# Patient Record
Sex: Female | Born: 1970 | Hispanic: Yes | Marital: Married | Smoking: Never smoker
Health system: Southern US, Community
[De-identification: ages and names within clinical notes are randomized; demographics above are authoritative.]

## PROBLEM LIST (undated history)

## (undated) DIAGNOSIS — I519 Heart disease, unspecified: Secondary | ICD-10-CM

## (undated) DIAGNOSIS — E119 Type 2 diabetes mellitus without complications: Secondary | ICD-10-CM

## (undated) HISTORY — DX: Type 2 diabetes mellitus without complications: E11.9

## (undated) HISTORY — DX: Heart disease, unspecified: I51.9

---

## 2006-03-10 ENCOUNTER — Ambulatory Visit: Payer: Self-pay

## 2006-05-25 ENCOUNTER — Emergency Department: Payer: Self-pay | Admitting: Emergency Medicine

## 2006-06-24 ENCOUNTER — Ambulatory Visit: Payer: Self-pay | Admitting: Obstetrics & Gynecology

## 2006-06-30 ENCOUNTER — Ambulatory Visit: Payer: Self-pay | Admitting: Obstetrics & Gynecology

## 2013-03-01 HISTORY — PX: HYSTEROSCOPY WITH D & C: SHX1775

## 2013-09-04 ENCOUNTER — Ambulatory Visit: Payer: Self-pay | Admitting: Obstetrics & Gynecology

## 2013-09-04 LAB — BASIC METABOLIC PANEL
ANION GAP: 8 (ref 7–16)
BUN: 17 mg/dL (ref 7–18)
CHLORIDE: 106 mmol/L (ref 98–107)
CO2: 26 mmol/L (ref 21–32)
Calcium, Total: 9 mg/dL (ref 8.5–10.1)
Creatinine: 0.73 mg/dL (ref 0.60–1.30)
EGFR (Non-African Amer.): >60
Glucose: 151 mg/dL — ABNORMAL HIGH (ref 65–99)
Osmolality: 284 (ref 275–301)
Potassium: 4.4 mmol/L (ref 3.5–5.1)
Sodium: 140 mmol/L (ref 136–145)

## 2013-09-04 LAB — CBC
HCT: 37.5 % (ref 35.0–47.0)
HGB: 12.2 g/dL (ref 12.0–16.0)
MCH: 27.2 pg (ref 26.0–34.0)
MCHC: 32.6 g/dL (ref 32.0–36.0)
MCV: 83 fL (ref 80–100)
Platelet: 366 10*3/uL (ref 150–440)
RBC: 4.5 10*6/uL (ref 3.80–5.20)
RDW: 16.2 % — ABNORMAL HIGH (ref 11.5–14.5)
WBC: 11.9 10*3/uL — ABNORMAL HIGH (ref 3.6–11.0)

## 2013-09-07 ENCOUNTER — Ambulatory Visit: Payer: Self-pay | Admitting: Obstetrics & Gynecology

## 2013-09-10 LAB — PATHOLOGY REPORT

## 2014-06-22 NOTE — Op Note (Signed)
PATIENT NAME:  Alexandra Bender, Charma MR#:  [id number] DATE OF BIRTH:  [birth year]  DATE OF PROCEDURE:  09/07/2013  PREOPERATIVE DIAGNOSIS: Abnormal uterine bleeding with menorrhagia and endometrial polyps.   POSTOPERATIVE DIAGNOSIS: Abnormal uterine bleeding with menorrhagia and endometrial polyps.   PROCEDURE PERFORMED: Hysteroscopy, dilation and curettage procedure.   SURGEON: Annamarie MajorPaul Harris, M.D.   ANESTHESIA: General.   ESTIMATED BLOOD LOSS: Minimal.   COMPLICATIONS: None.   FINDINGS: Proliferative endometrial lining with some polyp-like tissue involved.   SPECIMEN: Endometrial curettage.   DISPOSITION: Recovery room stable.   TECHNIQUE: The patient is prepped and draped in the usual sterile fashion.  After adequate anesthesia is obtained in the dorsal lithotomy position, bladder is drained with a Robinson catheter. Speculum is placed and the cervix is grasped with a tenaculum. The uterus is sounded to 8 cm.  Cervix is gently dilated with Hegar dilators.  A 30 degree hysteroscope is inserted with distention of the intrauterine cavity using lactated Ringer's. The above-mentioned findings are visualized. Using the MyoSure device, the thickened areas are removed through a resection process. Hysteroscope was removed with minimal discrepancy of fluid. A gentle curettage with a banjo curette is then performed.  Specimen is sent to pathology for further review. The patient has minimal bleeding. Tenaculum is removed and the patient went to recovery in stable condition. All sponge, instrument and needle counts are correct.     ____________________________ R. Annamarie MajorPaul Harris, MD rph:ts D: 09/07/2013 13:20:47 ET T: 09/07/2013 13:38:22 ET JOB#: [id number]  cc: Dierdre Searles. Paul Harris, MD, <Dictator> Nadara MustardOBERT P HARRIS MD ELECTRONICALLY SIGNED 09/07/2013 18:08

## 2017-08-02 ENCOUNTER — Telehealth: Payer: Self-pay | Admitting: Obstetrics & Gynecology

## 2017-08-02 NOTE — Telephone Encounter (Signed)
[facility] referring for Dysfunctional uterine bleeding. Called the interpreter service call patient. Albin FellingCarla called and left voicemail for patient to call back to be schedule

## 2017-08-15 NOTE — Telephone Encounter (Signed)
left voicemail for patient to call back to be schedule

## 2017-09-08 ENCOUNTER — Other Ambulatory Visit (HOSPITAL_COMMUNITY)
Admission: RE | Admit: 2017-09-08 | Discharge: 2017-09-08 | Disposition: A | Discharge status: Home / Self Care | Payer: Commercial Managed Care - PPO | Source: Ambulatory Visit | Attending: Obstetrics & Gynecology | Admitting: Obstetrics & Gynecology

## 2017-09-08 ENCOUNTER — Ambulatory Visit: Payer: Commercial Managed Care - PPO | Admitting: Obstetrics & Gynecology

## 2017-09-08 ENCOUNTER — Encounter: Payer: Self-pay | Admitting: Obstetrics & Gynecology

## 2017-09-08 VITALS — BP 120/80 | Ht 65.0 in | Wt 288.0 lb

## 2017-09-08 DIAGNOSIS — N8501 Benign endometrial hyperplasia: Secondary | ICD-10-CM | POA: Insufficient documentation

## 2017-09-08 DIAGNOSIS — N938 Other specified abnormal uterine and vaginal bleeding: Secondary | ICD-10-CM

## 2017-09-08 NOTE — Progress Notes (Signed)
Dysfunctional Uterine Bleeding Patient complains of irregular menses. She had been bleeding irregularly. She is now bleeding every 28-56 days and menses are lasting 6 days. She changes her pad or tampon every a few hours. Clots are min in size. Dysmenorrhea:none. Cyclic symptoms include: none. History of Endometrial Hyperplasia dx by D&C 2015 and again EMB 2017.  No recent PAP, no h/o abnormal PAP.   PMHx: She  has a past medical history of Diabetes mellitus without complication (HCC) and Heart disease. Also,  has a past surgical history that includes Hysteroscopy w/D&C (2015)., family history includes Diabetes in her maternal grandmother.,  reports that she has never smoked. She has never used smokeless tobacco. She reports that she does not drink alcohol or use drugs.  She has a current medication list which includes the following prescription(s): diltiazem, glipizide, and metformin. Also, is allergic to acetaminophen.  Review of Systems  Constitutional: Negative for chills, fever and malaise/fatigue.  HENT: Negative for congestion, sinus pain and sore throat.   Eyes: Negative for blurred vision and pain.  Respiratory: Negative for cough and wheezing.   Cardiovascular: Negative for chest pain and leg swelling.  Gastrointestinal: Negative for abdominal pain, constipation, diarrhea, heartburn, nausea and vomiting.  Genitourinary: Negative for dysuria, frequency, hematuria and urgency.  Musculoskeletal: Negative for back pain, joint pain, myalgias and neck pain.  Skin: Negative for itching and rash.  Neurological: Negative for dizziness, tremors and weakness.  Endo/Heme/Allergies: Does not bruise/bleed easily.  Psychiatric/Behavioral: Negative for depression. The patient is not nervous/anxious and does not have insomnia.    Objective: BP 120/80   Ht 5\' 5"  (1.651 m)   Wt 288 lb (130.6 kg)   LMP 07/21/2017   BMI 47.93 kg/m  Physical Exam  Constitutional: She is oriented to person, place,  and time. She appears well-developed and well-nourished. No distress.  Genitourinary: Rectum normal, vagina normal and uterus normal. Pelvic exam was performed with patient supine. There is no rash or lesion on the right labia. There is no rash or lesion on the left labia. Vagina exhibits no lesion. No bleeding in the vagina. Right adnexum does not display mass and does not display tenderness. Left adnexum does not display mass and does not display tenderness. Cervix does not exhibit motion tenderness, lesion, friability or polyp.   Uterus is mobile and midaxial. Uterus is not enlarged or exhibiting a mass.  HENT:  Head: Normocephalic and atraumatic. Head is without laceration.  Right Ear: Hearing normal.  Left Ear: Hearing normal.  Nose: No epistaxis.  No foreign bodies.  Mouth/Throat: Uvula is midline, oropharynx is clear and moist and mucous membranes are normal.  Eyes: Pupils are equal, round, and reactive to light.  Neck: Normal range of motion. Neck supple. No thyromegaly present.  Cardiovascular: Normal rate and regular rhythm. Exam reveals no gallop and no friction rub.  No murmur heard. Pulmonary/Chest: Effort normal and breath sounds normal. No respiratory distress. She has no wheezes. Right breast exhibits no mass, no skin change and no tenderness. Left breast exhibits no mass, no skin change and no tenderness.  Abdominal: Soft. Bowel sounds are normal. She exhibits no distension. There is no tenderness. There is no rebound.  Musculoskeletal: Normal range of motion.  Neurological: She is alert and oriented to person, place, and time. No cranial nerve deficit.  Skin: Skin is warm and dry.  Psychiatric: She has a normal mood and affect. Judgment normal.  Vitals reviewed.  Endometrial Biopsy After discussion with the patient regarding  her abnormal uterine bleeding I recommended that she proceed with an endometrial biopsy for further diagnosis. The risks, benefits, alternatives, and  indications for an endometrial biopsy were discussed with the patient in detail. She understood the risks including infection, bleeding, cervical laceration and uterine perforation.  Verbal consent was obtained.   PROCEDURE NOTE:  Pipelle endometrial biopsy was performed using aseptic technique with iodine preparation.  The uterus was sounded to a length of 8 cm.  Adequate sampling was obtained with minimal blood loss.  The patient tolerated the procedure well.  Disposition will be pending pathology.  ASSESSMENT/PLAN:   Problem List Items Addressed This Visit      Genitourinary   Endometrial hyperplasia without atypia, simple     Other   Dysfunctional uterine bleeding - Primary    PAP, EMB today Consider progesterone therapy to control DUB if EMB not worsened    Mirena, Provera as options    D&C also option Hysterectomy option for worsening hyperplasia F/u one week for results  Annamarie MajorPaul Martice Doty, MD, Merlinda FrederickFACOG [facility], [facility] 09/08/2017  8:14 AM

## 2017-09-08 NOTE — Patient Instructions (Signed)
Biopsia de endometrio - Cuidados posteriores  (Endometrial Biopsy, Care After)  Siga estas instrucciones durante las próximas semanas. Estas indicaciones le proporcionan información general acerca de cómo deberá cuidarse después del procedimiento. El médico también podrá darle instrucciones más específicas. El tratamiento se ha planificado de acuerdo a las prácticas médicas actuales, pero a veces se producen problemas. Comuníquese con el médico si tiene algún problema o tiene dudas después del procedimiento.  QUÉ ESPERAR DESPUÉS DEL PROCEDIMIENTO  Después del procedimiento, es típico tener las siguientes sensaciones:  · Sentirá cólicos leves y tendrá una pequeña cantidad de sangrado vaginal durante algunos días después del procedimiento. Esto es normal.  INSTRUCCIONES PARA EL CUIDADO EN EL HOGAR  · Tome sólo medicamentos de venta libre o recetados, según las indicaciones del médico.  · No utilice tampones, duchas vaginales ni tenga relaciones sexuales hasta que el profesional la autorice.  · Siga las indicaciones del médico relacionadas con la restricción a ciertas actividades, como ejercicios físicos intensos o levantar objetos pesados.    SOLICITE ATENCIÓN MÉDICA SI:  · Tiene un sangrado abundante o sangra durante más de 2 días después del procedimiento.  · Advierte un olor fétido que proviene de la vagina.  · Siente escalofríos o tiene fiebre.  · Siente un dolor en el bajo vientre (abdominal) muy intenso.    SOLICITE ATENCIÓN MÉDICA DE INMEDIATO SI:  · Siente cólicos intensos en el estómago o en la espalda.  · Elimina coágulos grandes.  · La hemorragia aumenta.  · Se siente mareada, débil, o se desmaya.    Esta información no tiene como fin reemplazar el consejo del médico. Asegúrese de hacerle al médico cualquier pregunta que tenga.  Document Released: 12/06/2012 Document Revised: 12/06/2012 Document Reviewed: 08/02/2012  Elsevier Interactive Patient Education © 2017 Elsevier Inc.

## 2017-09-12 LAB — CYTOLOGY - PAP: HPV (WINDOPATH): NOT DETECTED

## 2017-09-13 ENCOUNTER — Telehealth: Payer: Self-pay | Admitting: Obstetrics & Gynecology

## 2017-09-13 NOTE — Telephone Encounter (Signed)
-----   Message from Nadara Mustardobert P Harris, MD sent at 09/13/2017  1:24 PM EDT ----- Please change appt to [facility] but at same time as we already have interpreter this day

## 2017-09-13 NOTE — Telephone Encounter (Signed)
Appointment changed to [facility]. Interpreter requested

## 2017-09-13 NOTE — Progress Notes (Signed)
Please change appt to [facility] but at same time as we already have interpreter this day

## 2017-09-19 ENCOUNTER — Encounter: Payer: Self-pay | Admitting: Obstetrics & Gynecology

## 2017-09-19 ENCOUNTER — Ambulatory Visit (INDEPENDENT_AMBULATORY_CARE_PROVIDER_SITE_OTHER): Payer: Commercial Managed Care - PPO | Admitting: Obstetrics & Gynecology

## 2017-09-19 ENCOUNTER — Other Ambulatory Visit (HOSPITAL_COMMUNITY)
Admission: RE | Admit: 2017-09-19 | Discharge: 2017-09-19 | Disposition: A | Discharge status: Home / Self Care | Payer: Commercial Managed Care - PPO | Source: Ambulatory Visit | Attending: Obstetrics & Gynecology | Admitting: Obstetrics & Gynecology

## 2017-09-19 ENCOUNTER — Ambulatory Visit: Payer: Commercial Managed Care - PPO | Admitting: Obstetrics & Gynecology

## 2017-09-19 VITALS — BP 120/80 | Ht 66.0 in | Wt 288.0 lb

## 2017-09-19 DIAGNOSIS — R87619 Unspecified abnormal cytological findings in specimens from cervix uteri: Secondary | ICD-10-CM

## 2017-09-19 DIAGNOSIS — N870 Mild cervical dysplasia: Secondary | ICD-10-CM | POA: Diagnosis not present

## 2017-09-19 NOTE — Patient Instructions (Signed)
Colposcopía, Cuidado posterior  (Colposcopy, Care After)  La colposcopía es un procedimiento en el que se utiliza una herramienta especial para magnificar la superficie del cuello del útero. También es posible que se tome una muestra de tejido (biopsia). Esta muestra se observará para identificar la presencia de cáncer cervical u otros problemas. Después del procedimiento:  · Podrá sentir algunos cólicos.  · Recuéstese algunos minutos si se siente mareada.  · Podrá tener un sangrado que debería detenerse luego de algunos días.  CUIDADOS EN EL HOGAR  · No tenga relaciones sexuales ni use tampones durante 2 o 3 días o según le hayan indicado.  · Sólo tome medicamentos como lo indique su médico.  · Continúe tomando las pastillas anticonceptivas de la forma habitual.  Averigüe los resultados de su análisis  Pregunte cuándo estarán listos los resultados del examen. Asegúrese de obtener los resultados.  SOLICITE AYUDA DE INMEDIATO SI:  · Tiene un sangrado abundante o elimina coágulos.  · Su temperatura es de 102° F (38.9° C) o mayor.  · Observa una secreción vaginal anormal.  · Tiene cólicos que no se van con los medicamentos.  · Siente mareos, vértigo o pierde el conocimiento (se desmaya).  ASEGÚRESE DE QUE:   · Comprende estas instrucciones.  · Controlará su enfermedad.  · Solicitará ayuda de inmediato si no mejora o si empeora.  Esta información no tiene como fin reemplazar el consejo del médico. Asegúrese de hacerle al médico cualquier pregunta que tenga.  Document Released: 03/20/2010 Document Revised: 05/10/2011  Elsevier Interactive Patient Education © 2017 Elsevier Inc.

## 2017-09-19 NOTE — Progress Notes (Signed)
HPI:  Alexandra SchoolsMargarita Regalado Bender is a 47 y.o.  G1P1001  who presents today for evaluation and management of abnormal cervical cytology.    Dysplasia History:  AGUS recent PAP, no recent abnormal prior PAP EMB neg, prior h/o endometrial hyperplasia Periods every 1-3 mos; no complaints at this time  ROS:  Pertinent items noted in HPI and remainder of comprehensive ROS otherwise negative.  OB History  Gravida Para Term Preterm AB Living  1 1 1     1   SAB TAB Ectopic Multiple Live Births               # Outcome Date GA Lbr Len/2nd Weight Sex Delivery Anes PTL Lv  1 Term             Past Medical History:  Diagnosis Date  . Diabetes mellitus without complication (HCC)   . Heart disease     Past Surgical History:  Procedure Laterality Date  . HYSTEROSCOPY W/D&C  2015   Endometrial Hyperplasia    SOCIAL HISTORY: Social History   Substance and Sexual Activity  Alcohol Use Never  . Frequency: Never   Social History   Substance and Sexual Activity  Drug Use Never     Family History  Problem Relation Age of Onset  . Diabetes Maternal Grandmother     ALLERGIES:  Acetaminophen  Current Outpatient Medications on File Prior to Visit  Medication Sig Dispense Refill  . diltiazem (CARDIZEM CD) 120 MG 24 hr capsule Take by mouth.    Marland Kitchen. glipiZIDE (GLUCOTROL) 10 MG tablet Take by mouth.    . metFORMIN (GLUCOPHAGE) 1000 MG tablet Take by mouth.     No current facility-administered medications on file prior to visit.     Physical Exam: -Vitals:  BP 120/80   Ht 5\' 6"  (1.676 m)   Wt 288 lb (130.6 kg)   LMP 09/10/2017   BMI 46.48 kg/m  GEN: WD, WN, NAD.  A+ O x 3, good mood and affect. ABD:  NT, ND.  Soft, no masses.  No hernias noted.   Pelvic:   Vulva: Normal appearance.  No lesions.  Vagina: No lesions or abnormalities noted.  Support: Normal pelvic support.  Urethra No masses tenderness or scarring.  Meatus Normal size without lesions or prolapse.  Cervix: See  below.  Anus: Normal exam.  No lesions.  Perineum: Normal exam.  No lesions.        Bimanual   Uterus: Normal size.  Non-tender.  Mobile.  AV.  Adnexae: No masses.  Non-tender to palpation.  Cul-de-sac: Negative for abnormality.   PROCEDURE: 1.  Urine Pregnancy Test:  not done 2.  Colposcopy performed with 4% acetic acid after verbal consent obtained                                         -Aceto-white Lesions Location(s): None.              -Biopsy performed at 3,9 o'clock               -ECC indicated and performed: Yes.       -Biopsy sites made hemostatic with pressure, AgNO3, and/or Monsel's solution   -Satisfactory colposcopy: Yes.      -Evidence of Invasive cervical CA :  NO  ASSESSMENT:  Alexandra SchoolsMargarita Regalado Bender is a 47 y.o. G1P1001 here for  1. 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 PLAN: 1.  I discussed the grading system of pap smears and HPV high risk viral types.  We will discuss and base management after colpo results return. 2. Follow up PAP 6 months, vs intervention if high grade dysplasia identified 3. Treatment of persistantly abnormal PAP smears and cervical dysplasia, even mild, is discussed w pt today in detail, as well as the pros and cons of Cryo and LEEP procedures. Will consider and discuss after results.  Call w results (prefers after 330 or weekend call)     Annamarie Major, MD, [facility] Frederick Ob/G[facility], [facility] 09/19/2017  9:16 AM

## 2017-09-22 NOTE — Progress Notes (Signed)
D/w pt w interpreter

## 2017-09-22 NOTE — Progress Notes (Signed)
Biopsies normal Pt preferred call after 330 or on Saturday due to work. Will call w interpreter to relay good results to her F/u 6 mos

## 2018-03-22 ENCOUNTER — Other Ambulatory Visit (HOSPITAL_COMMUNITY)
Admission: RE | Admit: 2018-03-22 | Discharge: 2018-03-22 | Disposition: A | Discharge status: Home / Self Care | Payer: Commercial Managed Care - PPO | Source: Ambulatory Visit | Attending: Obstetrics & Gynecology | Admitting: Obstetrics & Gynecology

## 2018-03-22 ENCOUNTER — Encounter: Payer: Self-pay | Admitting: Obstetrics & Gynecology

## 2018-03-22 ENCOUNTER — Ambulatory Visit (INDEPENDENT_AMBULATORY_CARE_PROVIDER_SITE_OTHER): Payer: Commercial Managed Care - PPO | Admitting: Obstetrics & Gynecology

## 2018-03-22 VITALS — BP 110/60 | Wt 280.0 lb

## 2018-03-22 DIAGNOSIS — N938 Other specified abnormal uterine and vaginal bleeding: Secondary | ICD-10-CM | POA: Insufficient documentation

## 2018-03-22 DIAGNOSIS — R87619 Unspecified abnormal cytological findings in specimens from cervix uteri: Secondary | ICD-10-CM | POA: Insufficient documentation

## 2018-03-22 DIAGNOSIS — N8501 Benign endometrial hyperplasia: Secondary | ICD-10-CM

## 2018-03-22 NOTE — Progress Notes (Signed)
HPI:  Patient is a 48 y.o. G1P1001 presenting for follow up evaluation of abnormal PAP smear in the past.  Her last PAP was 6 months ago and approximate date 08/2017 and was abnormal: AGUS. She has had a prior colposcopy. Prior biopsies (if done) were Normal.  Prior Dx of ENDOMETRIAL HYPERPLASIA, last EMB was however normal. She has had D&C in past.  Periods have become more regular these last few months.  No hormone therapy.  PMHx: She  has a past medical history of Diabetes mellitus without complication (HCC) and Heart disease. Also,  has a past surgical history that includes Hysteroscopy w/D&C (2015)., family history includes Diabetes in her maternal grandmother.,  reports that she has never smoked. She has never used smokeless tobacco. She reports that she does not drink alcohol or use drugs.  She has a current medication list which includes the following prescription(s): ferrous sulfate, glipizide, metformin, diltiazem, and diltiazem. Also, is allergic to acetaminophen.  Review of Systems  Constitutional: Negative for chills, fever and malaise/fatigue.  HENT: Negative for congestion, sinus pain and sore throat.   Eyes: Negative for blurred vision and pain.  Respiratory: Negative for cough and wheezing.   Cardiovascular: Negative for chest pain and leg swelling.  Gastrointestinal: Negative for abdominal pain, constipation, diarrhea, heartburn, nausea and vomiting.  Genitourinary: Negative for dysuria, frequency, hematuria and urgency.  Musculoskeletal: Negative for back pain, joint pain, myalgias and neck pain.  Skin: Negative for itching and rash.  Neurological: Negative for dizziness, tremors and weakness.  Endo/Heme/Allergies: Does not bruise/bleed easily.  Psychiatric/Behavioral: Negative for depression. The patient is not nervous/anxious and does not have insomnia.     Objective: BP 110/60   Wt 280 lb (127 kg)   BMI 45.19 kg/m  Filed Weights   03/22/18 0803  Weight: 280 lb  (127 kg)   Body mass index is 45.19 kg/m.  Physical examination Physical Exam Constitutional:      General: She is not in acute distress.    Appearance: She is well-developed.  Genitourinary:     Pelvic exam was performed with patient supine.     Vagina and uterus normal.     No vaginal erythema or bleeding.     No cervical motion tenderness, discharge, polyp or nabothian cyst.     Uterus is mobile.     Uterus is not enlarged.     No uterine mass detected.    Uterus is midaxial.     No right or left adnexal mass present.     Right adnexa not tender.     Left adnexa not tender.  HENT:     Head: Normocephalic and atraumatic.     Nose: Nose normal.  Abdominal:     General: There is no distension.     Palpations: Abdomen is soft.     Tenderness: There is no abdominal tenderness.  Musculoskeletal: Normal range of motion.  Neurological:     Mental Status: She is alert and oriented to person, place, and time.     Cranial Nerves: No cranial nerve deficit.  Skin:    General: Skin is warm and dry.   ASSESSMENT:  1. Endometrial hyperplasia without atypia, simple 2. Atypical glandular cells of undetermined significance (AGUS) on cervical Pap smear 3. Dysfunctional uterine bleeding- improving   Plan:  1.  PAP and EMB TODAY.  I discussed the grading system of pap smears and HPV high risk viral types.   2. Follow up PAP 6 months, vs intervention  if high grade dysplasia identified. 3. Endometrial follow up based on results and sx's.  Can wait 1+ years if normal this time.  A total of 15 minutes were spent face-to-face with the patient during this encounter and over half of that time dealt with counseling and coordination of care.   Endometrial Biopsy After discussion with the patient regarding her abnormal uterine bleeding I recommended that she proceed with an endometrial biopsy for further diagnosis. The risks, benefits, alternatives, and indications for an endometrial biopsy  were discussed with the patient in detail. She understood the risks including infection, bleeding, cervical laceration and uterine perforation.  Verbal consent was obtained.   PROCEDURE NOTE:  Pipelle endometrial biopsy was performed using aseptic technique with iodine preparation.  The uterus was sounded to a length of 7 cm.  Adequate sampling was obtained with minimal blood loss.  The patient tolerated the procedure well.  Disposition will be pending pathology.  Annamarie MajorPaul Danisha Brassfield, MD, Merlinda FrederickFACOG [facility], [facility] 03/22/2018  8:05 AM

## 2018-03-27 LAB — CYTOLOGY - PAP: DIAGNOSIS: NEGATIVE

## 2018-03-28 NOTE — Progress Notes (Signed)
Called pt, no answer, LVMTRC. 

## 2018-03-28 NOTE — Progress Notes (Signed)
Pt aware.

## 2018-09-07 ENCOUNTER — Other Ambulatory Visit: Payer: Self-pay | Admitting: *Deleted

## 2018-09-07 DIAGNOSIS — Z20822 Contact with and (suspected) exposure to covid-19: Secondary | ICD-10-CM

## 2018-09-12 ENCOUNTER — Telehealth: Payer: Self-pay

## 2018-09-12 NOTE — Telephone Encounter (Signed)
Pt hung up before I agent was able to transfer call.

## 2018-09-13 ENCOUNTER — Telehealth: Payer: Self-pay | Admitting: *Deleted

## 2018-09-13 LAB — NOVEL CORONAVIRUS, NAA: SARS-CoV-2, NAA: DETECTED — AB

## 2018-09-13 NOTE — Telephone Encounter (Signed)
Pt returned call for Covid-19 results. Pt only speaks Spanish so contacted [place] interpreters for assistance. Pt requests call back

## 2018-09-13 NOTE — Telephone Encounter (Signed)
Called pt to give her test results of the coivid-19, using [employer] [id number]. Pt's results are positive, virus detected. Pt voiced understanding. She was advise to stay in quarantine, stay hydrated and be mindful of any fevers or shortness of breath. She voiced understanding. Advised to call 911 for respiratory distress.  She voiced understanding.

## 2018-09-20 ENCOUNTER — Ambulatory Visit: Payer: Commercial Managed Care - PPO | Admitting: Obstetrics & Gynecology

## 2019-01-18 ENCOUNTER — Ambulatory Visit: Payer: Commercial Managed Care - PPO | Admitting: Obstetrics & Gynecology

## 2019-01-31 ENCOUNTER — Other Ambulatory Visit (HOSPITAL_COMMUNITY)
Admission: RE | Admit: 2019-01-31 | Discharge: 2019-01-31 | Disposition: A | Discharge status: Home / Self Care | Payer: Commercial Managed Care - PPO | Source: Ambulatory Visit | Attending: Obstetrics & Gynecology | Admitting: Obstetrics & Gynecology

## 2019-01-31 ENCOUNTER — Encounter: Payer: Self-pay | Admitting: Obstetrics & Gynecology

## 2019-01-31 ENCOUNTER — Other Ambulatory Visit: Payer: Self-pay

## 2019-01-31 ENCOUNTER — Ambulatory Visit (INDEPENDENT_AMBULATORY_CARE_PROVIDER_SITE_OTHER): Payer: Commercial Managed Care - PPO | Admitting: Obstetrics & Gynecology

## 2019-01-31 VITALS — BP 140/80 | Wt 282.0 lb

## 2019-01-31 DIAGNOSIS — R87619 Unspecified abnormal cytological findings in specimens from cervix uteri: Secondary | ICD-10-CM

## 2019-01-31 NOTE — Progress Notes (Signed)
  HPI:  Patient is a 48 y.o. G1P1001 presenting for follow up evaluation of abnormal PAP smear in the past.  Her last PAP was 10 months ago and was normal and prior PAP was AGUS, with notmal BX as well as EMB. She has had a prior colposcopy. Prior biopsies (if done) were Normal. EMB 08/2017 and 03/2018 normal.  Periods are currently regular w no BTB.  PMHx: She  has a past medical history of Diabetes mellitus without complication ([place]) and Heart disease. Also,  has a past surgical history that includes Hysteroscopy w/D&C (2015)., family history includes Diabetes in her maternal grandmother.,  reports that she has never smoked. She has never used smokeless tobacco. She reports that she does not drink alcohol or use drugs.  She has a current medication list which includes the following prescription(s): diltiazem, diltiazem, ferrous sulfate, glipizide, and metformin. Also, is allergic to acetaminophen.  Review of Systems  All other systems reviewed and are negative.   Objective: BP 140/80   Wt 282 lb (127.9 kg)   BMI 45.52 kg/m  Filed Weights   01/31/19 1335  Weight: 282 lb (127.9 kg)   Body mass index is 45.52 kg/m.  Physical examination Physical Exam Constitutional:      General: She is not in acute distress.    Appearance: She is well-developed. She is obese.  Genitourinary:     Pelvic exam was performed with patient supine.     Vagina and uterus normal.     No vaginal erythema or bleeding.     No cervical motion tenderness, discharge, polyp or nabothian cyst.     Uterus is mobile.     Uterus is not enlarged.     No uterine mass detected.    Uterus is midaxial.     No right or left adnexal mass present.     Right adnexa not tender.     Left adnexa not tender.  HENT:     Head: Normocephalic and atraumatic.     Nose: Nose normal.  Abdominal:     General: There is no distension.     Palpations: Abdomen is soft.     Tenderness: There is no abdominal tenderness.   Musculoskeletal: Normal range of motion.  Neurological:     Mental Status: She is alert and oriented to person, place, and time.     Cranial Nerves: No cranial nerve deficit.  Skin:    General: Skin is warm and dry.  Psychiatric:        Attention and Perception: Attention normal.        Mood and Affect: Mood and affect normal.        Speech: Speech normal.        Behavior: Behavior normal.        Thought Content: Thought content normal.        Judgment: Judgment normal.     ASSESSMENT:  History of Cervical Dysplasia - AGUS  Plan:  1.  I discussed the grading system of pap smears and HPV high risk viral types.   2. Follow up PAP 12 months if normal, vs intervention if high grade dysplasia identified. 3. Treatment of persistantly abnormal PAP smears and cervical dysplasia, even mild, is discussed w pt today in detail, as well as the pros and cons of Cryo and LEEP procedures. Will consider and discuss after results.   Barnett Applebaum, MD, [facility], [facility] 01/31/2019  1:38 PM

## 2019-02-05 LAB — CYTOLOGY - PAP: Diagnosis: NEGATIVE

## 2021-02-20 ENCOUNTER — Ambulatory Visit: Payer: Commercial Managed Care - PPO | Admitting: Obstetrics

## 2021-03-18 ENCOUNTER — Ambulatory Visit (INDEPENDENT_AMBULATORY_CARE_PROVIDER_SITE_OTHER): Payer: Commercial Managed Care - PPO | Admitting: Obstetrics

## 2021-03-18 ENCOUNTER — Other Ambulatory Visit (HOSPITAL_COMMUNITY)
Admission: RE | Admit: 2021-03-18 | Discharge: 2021-03-18 | Disposition: A | Discharge status: Home / Self Care | Payer: Commercial Managed Care - PPO | Source: Ambulatory Visit | Attending: Obstetrics | Admitting: Obstetrics

## 2021-03-18 ENCOUNTER — Encounter: Payer: Self-pay | Admitting: Obstetrics

## 2021-03-18 VITALS — BP 126/84 | Ht 66.0 in | Wt 255.0 lb

## 2021-03-18 DIAGNOSIS — Z01419 Encounter for gynecological examination (general) (routine) without abnormal findings: Secondary | ICD-10-CM

## 2021-03-18 DIAGNOSIS — Z1231 Encounter for screening mammogram for malignant neoplasm of breast: Secondary | ICD-10-CM

## 2021-03-18 DIAGNOSIS — Z124 Encounter for screening for malignant neoplasm of cervix: Secondary | ICD-10-CM | POA: Diagnosis present

## 2021-03-18 DIAGNOSIS — Z113 Encounter for screening for infections with a predominantly sexual mode of transmission: Secondary | ICD-10-CM | POA: Diagnosis present

## 2021-03-18 NOTE — Progress Notes (Signed)
Gynecology Annual Exam  PCP: Patient, No Pcp Per (Inactive)  Chief Complaint:  Chief Complaint  Patient presents with   Annual Exam    History of Present Illness: Patient is a 51 y.o. G1P1001 presents for annual exam. The patient has no complaints today. She is requesting testing for possible yeast vaginitis. She is a diabetic and her PCP suggested she have testing today. Alexandra Bender is married with one child.She has never had a mammogram. It has been several years since she had a pap smear.  LMP: Patient's last menstrual period was 02/18/2021. Average Interval: regular, 28 days Duration of flow: 6 days Heavy Menses: yes Clots: no Intermenstrual Bleeding: no Postcoital Bleeding: no Dysmenorrhea: no   The patient is sexually active. She currently uses none for contraception. She denies dyspareunia.  The patient does not perform self breast exams.  There is no notable family history of breast or ovarian cancer in her family.  The patient wears seatbelts: yes.   The patient has regular exercise: no.    The patient denies current symptoms of depression.    Review of Systems: ROS  Past Medical History:  Patient Active Problem List   Diagnosis Date Noted   Dysfunctional uterine bleeding 09/08/2017   Endometrial hyperplasia without atypia, simple 09/08/2017    Past Surgical History:  Past Surgical History:  Procedure Laterality Date   HYSTEROSCOPY WITH D & C  2015   Endometrial Hyperplasia    Gynecologic History:  Patient's last menstrual period was 02/18/2021. Contraception: none Last Pap: Results were: 2020 no abnormalities  Last mammogram: never had one   Obstetric History: G1P1001  Family History:  Family History  Problem Relation Age of Onset   Diabetes Maternal Grandmother     Social History:  Social History   Socioeconomic History   Marital status: Married    Spouse name: Not on file   Number of children: Not on file   Years of education: Not on  file   Highest education level: Not on file  Occupational History   Not on file  Tobacco Use   Smoking status: Never   Smokeless tobacco: Never  Vaping Use   Vaping Use: Never used  Substance and Sexual Activity   Alcohol use: Never   Drug use: Never   Sexual activity: Yes    Birth control/protection: None  Other Topics Concern   Not on file  Social History Narrative   Not on file   Social Determinants of Health   Financial Resource Strain: Not on file  Food Insecurity: Not on file  Transportation Needs: Not on file  Physical Activity: Not on file  Stress: Not on file  Social Connections: Not on file  Intimate Partner Violence: Not on file    Allergies:  Allergies  Allergen Reactions   Acetaminophen Swelling    Lip swelling (about 18 years ago)    Medications: Prior to Admission medications   Medication Sig Start Date End Date Taking? Authorizing Provider  ferrous sulfate (FER-IN-SOL) 75 (15 Fe) MG/ML SOLN Take by mouth.   Yes [provider]  glipiZIDE (GLUCOTROL) 10 MG tablet Take by mouth.   Yes [provider]  metFORMIN (GLUCOPHAGE) 1000 MG tablet Take by mouth.   Yes [provider]  diltiazem (CARDIZEM CD) 120 MG 24 hr capsule Take by mouth. 04/12/17 10/09/17  [provider]  diltiazem (CARDIZEM) 30 MG tablet Take by mouth. Patient not taking: Reported on 03/18/2021    [provider]  JARDIANCE 10 MG TABS tablet Take 10 mg by mouth daily. 03/12/21   [provider]    Physical Exam Vitals: Blood pressure 126/84, height 5\' 6"  (1.676 m), weight 255 lb (115.7 kg), last menstrual period 02/18/2021.  General: NAD HEENT: normocephalic, anicteric Thyroid: no enlargement, no palpable nodules Pulmonary: No increased work of breathing, CTAB Cardiovascular: RRR, distal pulses 2+ Breast: Breast symmetrical, no tenderness, no palpable nodules or masses, no skin or nipple retraction present, no nipple discharge.  No  axillary or supraclavicular lymphadenopathy. Abdomen: NABS, soft, non-tender, non-distended.  Umbilicus without lesions.  No hepatomegaly, splenomegaly or masses palpable. No evidence of hernia  Genitourinary:  External: Normal external female genitalia.  Normal urethral meatus, normal Bartholin's and Skene's glands.    Vagina: Normal vaginal mucosa, no evidence of prolapse.    Cervix: Grossly normal in appearance, no bleeding  Uterus: Non-enlarged, mobile, normal contour.  No CMT  Adnexa: ovaries non-enlarged, no adnexal masses  Rectal: deferred  Lymphatic: no evidence of inguinal lymphadenopathy Extremities: no edema, erythema, or tenderness Neurologic: Grossly intact Psychiatric: mood appropriate, affect full  Female chaperone present for pelvic and breast  portions of the physical exam    Assessment: 51 y.o. G1P1001 routine annual exam  Plan: Problem List Items Addressed This Visit   None Visit Diagnoses     Screening mammogram for breast cancer    -  Primary   Relevant Orders   MM DIGITAL SCREENING BILATERAL   Women's annual routine gynecological examination       Relevant Orders   Cervicovaginal ancillary only   Screen for STD (sexually transmitted disease)       Relevant Orders   Cervicovaginal ancillary only   Cervical cancer screening       Relevant Orders   Cytology - PAP       1) Mammogram - recommend yearly screening mammogram.  Mammogram Was ordered today We stressed the importance of annual mammograms from now on.   2) STI screening  wasoffered and accepted  3) ASCCP guidelines and rational discussed.  Patient opts for every 5 years screening interval  4) Contraception - the patient is currently using  none.  She is happy with her current form of contraception and plans to continue  5) Colonoscopy -- Screening recommended starting at age 50 for average risk individuals, age 30 for individuals deemed at increased risk (including African Americans) and  recommended to continue until age 50.  For patient age 50-85 individualized approach is recommended.  Gold standard screening is via colonoscopy, Cologuard screening is an acceptable alternative for patient unwilling or unable to undergo colonoscopy.  "Colorectal cancer screening for average?risk adults: 2018 guideline update from the American Cancer Society"[place]: A Cancer Journal for Clinicians: Jul 28, 2016   6) Routine healthcare maintenance including cholesterol, diabetes screening discussed managed by PCP  7) Return in about 1 year (around 03/18/2022) for annual.   03/20/2022, CNM  03/18/2021 2:24 PM   [facility], [facility] 03/18/2021, 2:24 PM

## 2021-03-20 LAB — CERVICOVAGINAL ANCILLARY ONLY
Bacterial Vaginitis (gardnerella): POSITIVE — AB
Candida Glabrata: NEGATIVE
Candida Vaginitis: POSITIVE — AB
Chlamydia: NEGATIVE
Comment: NEGATIVE
Comment: NEGATIVE
Comment: NEGATIVE
Comment: NEGATIVE
Comment: NEGATIVE
Comment: NORMAL
Neisseria Gonorrhea: NEGATIVE
Trichomonas: NEGATIVE

## 2021-03-20 LAB — CYTOLOGY - PAP
Comment: NEGATIVE
Diagnosis: NEGATIVE
High risk HPV: NEGATIVE

## 2021-03-23 ENCOUNTER — Encounter: Payer: Self-pay | Admitting: Obstetrics

## 2021-03-23 ENCOUNTER — Other Ambulatory Visit: Payer: Self-pay | Admitting: Obstetrics

## 2021-03-23 DIAGNOSIS — B3731 Acute candidiasis of vulva and vagina: Secondary | ICD-10-CM

## 2021-03-23 DIAGNOSIS — B9689 Other specified bacterial agents as the cause of diseases classified elsewhere: Secondary | ICD-10-CM

## 2021-03-23 MED ORDER — METRONIDAZOLE 500 MG PO TABS: 500.0000 mg | ORAL_TABLET | Freq: Two times a day (BID) | ORAL | 0 refills | Status: AC

## 2021-03-23 MED ORDER — FLUCONAZOLE 150 MG PO TABS: 150.0000 mg | ORAL_TABLET | Freq: Once | ORAL | 0 refills | Status: AC

## 2021-03-24 ENCOUNTER — Telehealth: Payer: Self-pay

## 2021-03-24 NOTE — Telephone Encounter (Signed)
NEEDS INTERPRETER! Called pt to give results from annual visit with MMF. No answer and voice mail not set up.

## 2021-03-24 NOTE — Telephone Encounter (Signed)
Pt aware of test results

## 2021-04-02 ENCOUNTER — Other Ambulatory Visit: Payer: Self-pay | Admitting: Obstetrics

## 2021-04-02 DIAGNOSIS — Z1231 Encounter for screening mammogram for malignant neoplasm of breast: Secondary | ICD-10-CM

## 2021-05-25 ENCOUNTER — Inpatient Hospital Stay: Admission: RE | Admit: 2021-05-25 | Payer: Commercial Managed Care - PPO | Source: Ambulatory Visit

## 2021-05-26 ENCOUNTER — Ambulatory Visit
Admission: RE | Admit: 2021-05-26 | Discharge: 2021-05-26 | Disposition: A | Discharge status: Home / Self Care | Payer: Commercial Managed Care - PPO | Source: Ambulatory Visit | Attending: Obstetrics | Admitting: Obstetrics

## 2021-05-26 ENCOUNTER — Other Ambulatory Visit: Payer: Self-pay

## 2021-05-26 DIAGNOSIS — Z1231 Encounter for screening mammogram for malignant neoplasm of breast: Secondary | ICD-10-CM | POA: Insufficient documentation

## 2022-05-27 ENCOUNTER — Other Ambulatory Visit: Payer: Self-pay | Admitting: Primary Care

## 2022-05-27 DIAGNOSIS — Z1231 Encounter for screening mammogram for malignant neoplasm of breast: Secondary | ICD-10-CM

## 2023-03-13 ENCOUNTER — Emergency Department
Admission: EM | Admit: 2023-03-13 | Discharge: 2023-03-13 | Disposition: A | Discharge status: Home / Self Care | Payer: BLUE CROSS/BLUE SHIELD | Attending: Emergency Medicine | Admitting: Emergency Medicine

## 2023-03-13 ENCOUNTER — Emergency Department: Payer: BLUE CROSS/BLUE SHIELD

## 2023-03-13 ENCOUNTER — Other Ambulatory Visit: Payer: Self-pay

## 2023-03-13 DIAGNOSIS — K5641 Fecal impaction: Secondary | ICD-10-CM | POA: Insufficient documentation

## 2023-03-13 DIAGNOSIS — K5900 Constipation, unspecified: Secondary | ICD-10-CM | POA: Diagnosis present

## 2023-03-13 DIAGNOSIS — K5289 Other specified noninfective gastroenteritis and colitis: Secondary | ICD-10-CM | POA: Diagnosis not present

## 2023-03-13 DIAGNOSIS — E119 Type 2 diabetes mellitus without complications: Secondary | ICD-10-CM | POA: Insufficient documentation

## 2023-03-13 DIAGNOSIS — D72829 Elevated white blood cell count, unspecified: Secondary | ICD-10-CM | POA: Diagnosis not present

## 2023-03-13 LAB — COMPREHENSIVE METABOLIC PANEL
ALT: 15 U/L (ref 0–44)
AST: 16 U/L (ref 15–41)
Albumin: 4.4 g/dL (ref 3.5–5.0)
Alkaline Phosphatase: 74 U/L (ref 38–126)
Anion gap: 13 (ref 5–15)
BUN: 12 mg/dL (ref 6–20)
CO2: 22 mmol/L (ref 22–32)
Calcium: 9.3 mg/dL (ref 8.9–10.3)
Chloride: 101 mmol/L (ref 98–111)
Creatinine, Ser: 0.57 mg/dL (ref 0.44–1.00)
GFR, Estimated: >60 mL/min (ref >60)
Glucose, Bld: 123 mg/dL — ABNORMAL HIGH (ref 70–99)
Potassium: 3.9 mmol/L (ref 3.5–5.1)
Sodium: 136 mmol/L (ref 135–145)
Total Bilirubin: 0.9 mg/dL (ref 0.0–1.2)
Total Protein: 8.9 g/dL — ABNORMAL HIGH (ref 6.5–8.1)

## 2023-03-13 LAB — CBC WITH DIFFERENTIAL/PLATELET
Abs Immature Granulocytes: 0.07 10*3/uL (ref 0.00–0.07)
Basophils Absolute: 0.1 10*3/uL (ref 0.0–0.1)
Basophils Relative: 0 %
Eosinophils Absolute: 0 10*3/uL (ref 0.0–0.5)
Eosinophils Relative: 0 %
HCT: 38.8 % (ref 36.0–46.0)
Hemoglobin: 12 g/dL (ref 12.0–15.0)
Immature Granulocytes: 0 %
Lymphocytes Relative: 16 %
Lymphs Abs: 2.8 10*3/uL (ref 0.7–4.0)
MCH: 23.3 pg — ABNORMAL LOW (ref 26.0–34.0)
MCHC: 30.9 g/dL (ref 30.0–36.0)
MCV: 75.3 fL — ABNORMAL LOW (ref 80.0–100.0)
Monocytes Absolute: 1.1 10*3/uL — ABNORMAL HIGH (ref 0.1–1.0)
Monocytes Relative: 6 %
Neutro Abs: 13.8 10*3/uL — ABNORMAL HIGH (ref 1.7–7.7)
Neutrophils Relative %: 78 %
Platelets: 611 10*3/uL — ABNORMAL HIGH (ref 150–400)
RBC: 5.15 MIL/uL — ABNORMAL HIGH (ref 3.87–5.11)
RDW: 18.2 % — ABNORMAL HIGH (ref 11.5–15.5)
WBC: 17.9 10*3/uL — ABNORMAL HIGH (ref 4.0–10.5)
nRBC: 0 % (ref 0.0–0.2)

## 2023-03-13 LAB — LIPASE, BLOOD: Lipase: 48 U/L (ref 11–51)

## 2023-03-13 LAB — CBG MONITORING, ED: Glucose-Capillary: 92 mg/dL (ref 70–99)

## 2023-03-13 MED ORDER — IOHEXOL 300 MG/ML  SOLN
100.0000 mL | Freq: Once | INTRAMUSCULAR | Status: AC | PRN
  Administered 2023-03-13: 100 mL via INTRAVENOUS

## 2023-03-13 MED ORDER — SODIUM CHLORIDE 0.9 % IV BOLUS
1000.0000 mL | Freq: Once | INTRAVENOUS | Status: AC
  Administered 2023-03-13: 1000 mL via INTRAVENOUS

## 2023-03-13 MED ORDER — DOCUSATE SODIUM 100 MG PO CAPS: 100.0000 mg | ORAL_CAPSULE | Freq: Two times a day (BID) | ORAL | 0 refills | Status: AC

## 2023-03-13 MED ORDER — AMOXICILLIN-POT CLAVULANATE 875-125 MG PO TABS: 1.0000 | ORAL_TABLET | Freq: Two times a day (BID) | ORAL | 0 refills | Status: AC

## 2023-03-13 NOTE — ED Provider Notes (Signed)
 [facility] Provider Note    Event Date/Time   First MD Initiated Contact with Patient 03/13/23 1543     (approximate)   History   Constipation   HPI  Alexandra Bender is a 53 y.o. female with a past medical history of obesity, anemia, diabetes who presents today for evaluation of 6 days of constipation.  Patient reports that she is not passing gas.  She reports that she feels nauseated but has not had any vomiting.  She has no history of abdominal surgery.  She denies any problems with urination.  She took 4 days of MiraLAX without improvement of her symptoms.  Patient Active Problem List   Diagnosis Date Noted   Dysfunctional uterine bleeding 09/08/2017   Endometrial hyperplasia without atypia, simple 09/08/2017          Physical Exam   Triage Vital Signs: ED Triage Vitals [03/13/23 1342]  Encounter Vitals Group     BP (!) 143/79     Systolic BP Percentile      Diastolic BP Percentile      Pulse Rate (!) 114     Resp 18     Temp 98 F (36.7 C)     Temp src      SpO2 100 %     Weight 248 lb (112.5 kg)     Height 5' 4 (1.626 m)     Head Circumference      Peak Flow      Pain Score 10     Pain Loc      Pain Education      Exclude from Growth Chart     Most recent vital signs: Vitals:   03/13/23 1851 03/13/23 2024  BP: 135/70 133/74  Pulse: 79 87  Resp: 18 18  Temp: 98.1 F (36.7 C)   SpO2: 97% 97%    Physical Exam Vitals and nursing note reviewed.  Constitutional:      General: Awake and alert. No acute distress.    Appearance: Normal appearance. The patient is obese.  HENT:     Head: Normocephalic and atraumatic.     Mouth: Mucous membranes are moist.  Eyes:     General: PERRL. Normal EOMs        Right eye: No discharge.        Left eye: No discharge.     Conjunctiva/sclera: Conjunctivae normal.  Cardiovascular:     Rate and Rhythm: Normal rate and regular rhythm.     Pulses: Normal pulses.  Pulmonary:      Effort: Pulmonary effort is normal. No respiratory distress.     Breath sounds: Normal breath sounds.  Abdominal:     Abdomen is soft. There is diffuse abdominal tenderness. No rebound or guarding. No distention. Musculoskeletal:        General: No swelling. Normal range of motion.     Cervical back: Normal range of motion and neck supple.  Skin:    General: Skin is warm and dry.     Capillary Refill: Capillary refill takes less than 2 seconds.     Findings: No rash.  Neurological:     Mental Status: The patient is awake and alert.      ED Results / Procedures / Treatments   Labs (all labs ordered are listed, but only abnormal results are displayed) Labs Reviewed  COMPREHENSIVE METABOLIC PANEL - Abnormal; Notable for the following components:      Result Value   Glucose, Bld  123 (*)    Total Protein 8.9 (*)    All other components within normal limits  CBC WITH DIFFERENTIAL/PLATELET - Abnormal; Notable for the following components:   WBC 17.9 (*)    RBC 5.15 (*)    MCV 75.3 (*)    MCH 23.3 (*)    RDW 18.2 (*)    Platelets 611 (*)    Neutro Abs 13.8 (*)    Monocytes Absolute 1.1 (*)    All other components within normal limits  LIPASE, BLOOD  CBG MONITORING, ED     EKG     RADIOLOGY I independently reviewed and interpreted imaging and agree with radiologists findings.     PROCEDURES:  Critical Care performed:   .Fecal disimpaction  Date/Time: 03/13/2023 7:34 PM  Performed by: Symphonie Schneiderman E, PA-C Authorized by: Adlyn Fife E, PA-C  Consent: Verbal consent obtained. Risks and benefits: risks, benefits and alternatives were discussed Consent given by: patient Patient understanding: patient states understanding of the procedure being performed Patient consent: the patient's understanding of the procedure matches consent given Procedure consent: procedure consent matches procedure scheduled Relevant documents: relevant documents present and  verified Test results: test results available and properly labeled Site marked: the operative site was marked Imaging studies: imaging studies available Required items: required blood products, implants, devices, and special equipment available Patient identity confirmed: verbally with patient Time out: Immediately prior to procedure a time out was called to verify the correct patient, procedure, equipment, support staff and site/side marked as required. Preparation: Patient was prepped and draped in the usual sterile fashion. Local anesthesia used: no  Anesthesia: Local anesthesia used: no  Sedation: Patient sedated: no  Patient tolerance: patient tolerated the procedure well with no immediate complications Comments: Brown stool in rectal vault, no blood or tarry stool. No external abnormalities or evidence of abscess or infection      MEDICATIONS ORDERED IN ED: Medications  sodium chloride  0.9 % bolus 1,000 mL (0 mLs Intravenous Stopped 03/13/23 1801)  iohexol  (OMNIPAQUE ) 300 MG/ML solution 100 mL (100 mLs Intravenous Contrast Given 03/13/23 1737)     IMPRESSION / MDM / ASSESSMENT AND PLAN / ED COURSE  I reviewed the triage vital signs and the nursing notes.   Differential diagnosis includes, but is not limited to, constipation, small bowel obstruction, diverticulitis, urinary tract infection.  Patient is awake and alert, tachycardic to 114, though normotensive and afebrile.    I reviewed the patient's chart.  She is followed by hematology for her iron deficiency anemia.  No previous abdominal imaging.  X-ray of her abdomen was obtained in triage and reveals borderline prominence of stool in the colon.  However, I do feel that further workup is indicated.  IV was established and labs were obtained as well as CT scan of her abdomen pelvis.  She was given a liter of normal saline for her tachycardia and her tachycardia resolved.  Labs revealed leukocytosis to 17.9 with a  neutrophilic predominance.  Creatinine and electrolytes are otherwise at their baseline.  Stable H&H.  CT scan obtained reveals fecal disimpaction and constipation, as well as findings consistent with possible stercoral colitis.  Patient agreed with fecal impaction which was performed with good effect.  Brown stool was removed from the rectal vault, patient tolerated the procedure well with no immediate complications.  No bloody stool or tarry stool noted.  Patient was instructed to continue her MiraLAX and also start Colace.  She was given antibiotics for her stercoral colitis  as well.  We discussed tricked return precautions and the importance of close outpatient follow-up.  Patient understands and agrees with plan.  She was discharged in stable condition.   Patient's presentation is most consistent with acute presentation with potential threat to life or bodily function.    FINAL CLINICAL IMPRESSION(S) / ED DIAGNOSES   Final diagnoses:  Fecal impaction in rectum (HCC)  Stercoral colitis     Rx / DC Orders   ED Discharge Orders          Ordered    amoxicillin -clavulanate (AUGMENTIN ) 875-125 MG tablet  2 times daily        03/13/23 1925    docusate sodium  (COLACE) 100 MG capsule  2 times daily        03/13/23 1925             Note:  This document was prepared using Dragon voice recognition software and may include unintentional dictation errors.   Garnett Rekowski E, PA-C 03/13/23 2053    Arlander Charleston, MD 03/15/23 1319

## 2023-03-13 NOTE — Discharge Instructions (Signed)
 Please take the medications as prescribed.  Please follow-up with your outpatient provider.  Please increase the fiber in your diet and you may continue taking the MiraLAX.  Please return for any new, worsening, or change in symptoms or other concerns.  It was a pleasure caring for you today.

## 2023-03-13 NOTE — ED Triage Notes (Signed)
 Pt comes with 6 days of constipation. Pt states pain in belly from it. Pt denies any vomiting.

## 2023-03-13 NOTE — ED Provider Triage Note (Signed)
 Emergency Medicine Provider Triage Evaluation Note  Alexandra Bender , a 53 y.o. female  was evaluated in triage.  Pt complains of constipation x 6 days. No nausea or vomiting. No relief with OTC medications.  Physical Exam  There were no vitals taken for this visit. Gen:   Awake, no distress   Resp:  Normal effort  MSK:   Moves extremities without difficulty  Other:    Medical Decision Making  Medically screening exam initiated at 1:41 PM.  Appropriate orders placed.  Ragena Madelyne Guijosa was informed that the remainder of the evaluation will be completed by another provider, this initial triage assessment does not replace that evaluation, and the importance of remaining in the ED until their evaluation is complete.    Herlinda Kirk NOVAK, FNP 03/13/23 1343

## 2023-07-18 ENCOUNTER — Other Ambulatory Visit: Payer: Self-pay | Admitting: Primary Care

## 2023-07-18 DIAGNOSIS — Z1231 Encounter for screening mammogram for malignant neoplasm of breast: Secondary | ICD-10-CM

## 2024-01-04 IMAGING — MG MM DIGITAL SCREENING BILAT W/ TOMO AND CAD
6 of 10 series · 6 of 30 positions shown · non-contrast
Comparison: None.

CLINICAL DATA: Screening. Baseline.

EXAM:
DIGITAL SCREENING BILATERAL MAMMOGRAM WITH TOMOSYNTHESIS AND CAD
TECHNIQUE: Bilateral screening digital craniocaudal and mediolateral oblique
mammograms were obtained. Bilateral screening digital breast
tomosynthesis was performed. The images were evaluated with
computer-aided detection.

[L MLO synth-2D (1 of 2)]
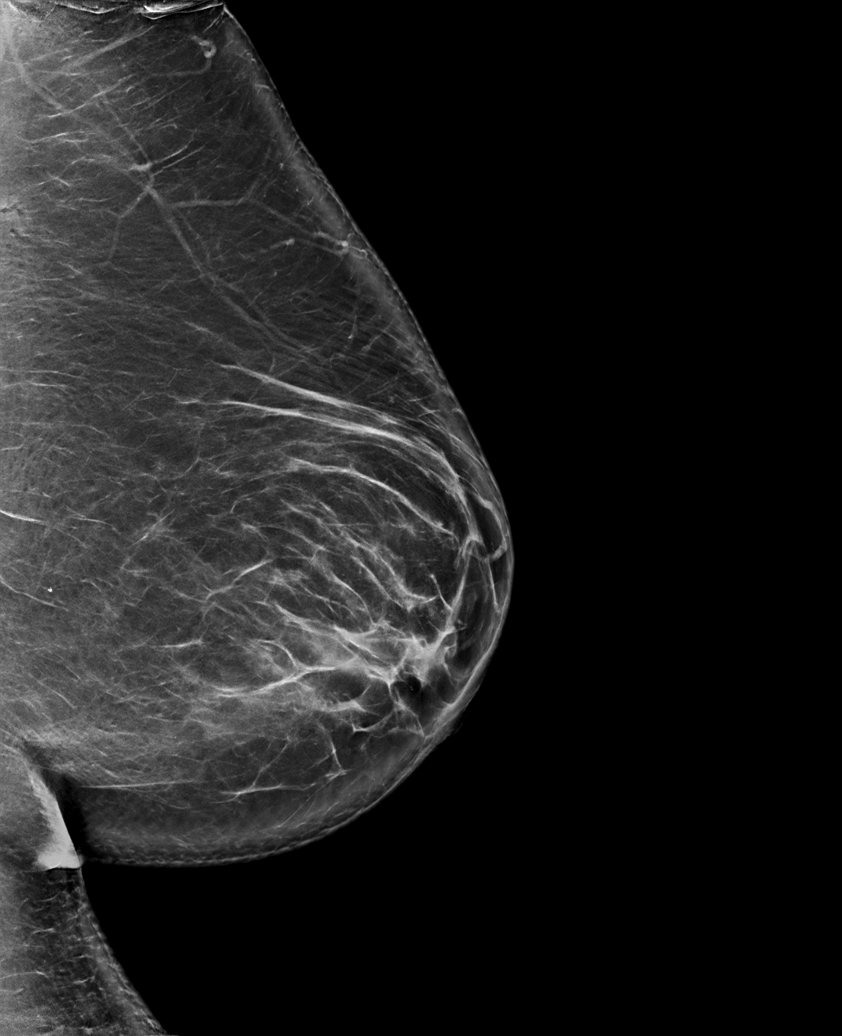

[R CC synth-2D]
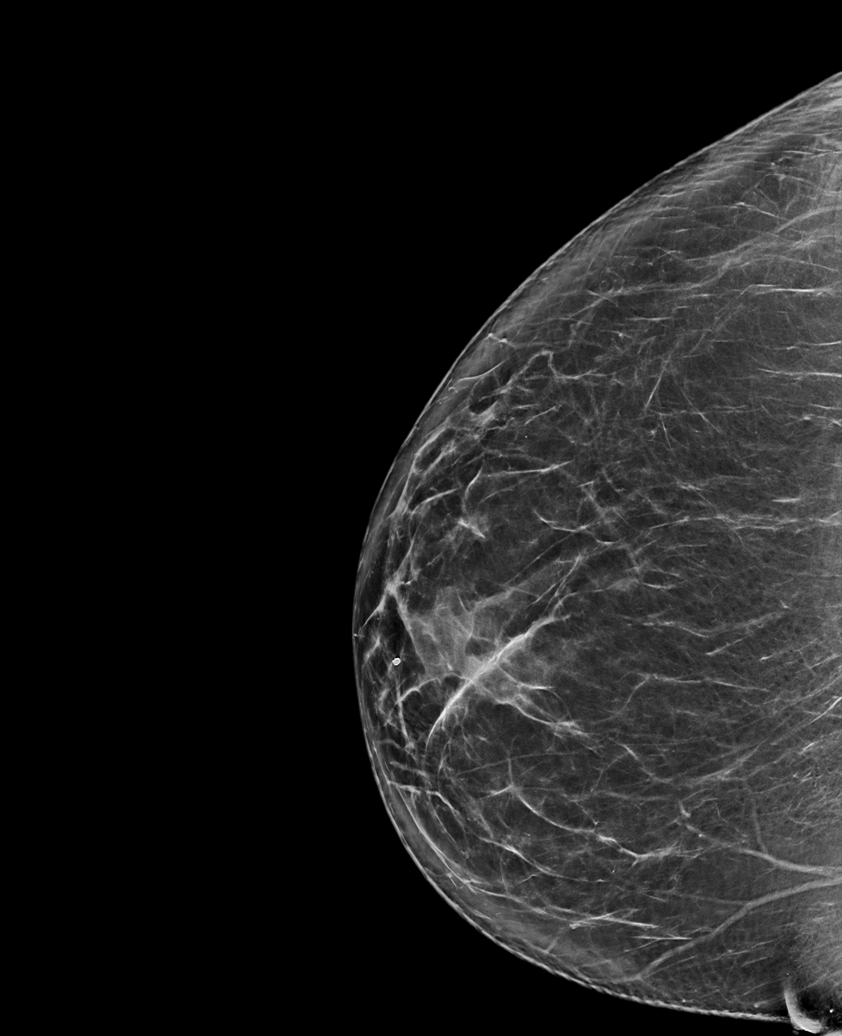

[R MLO synth-2D]
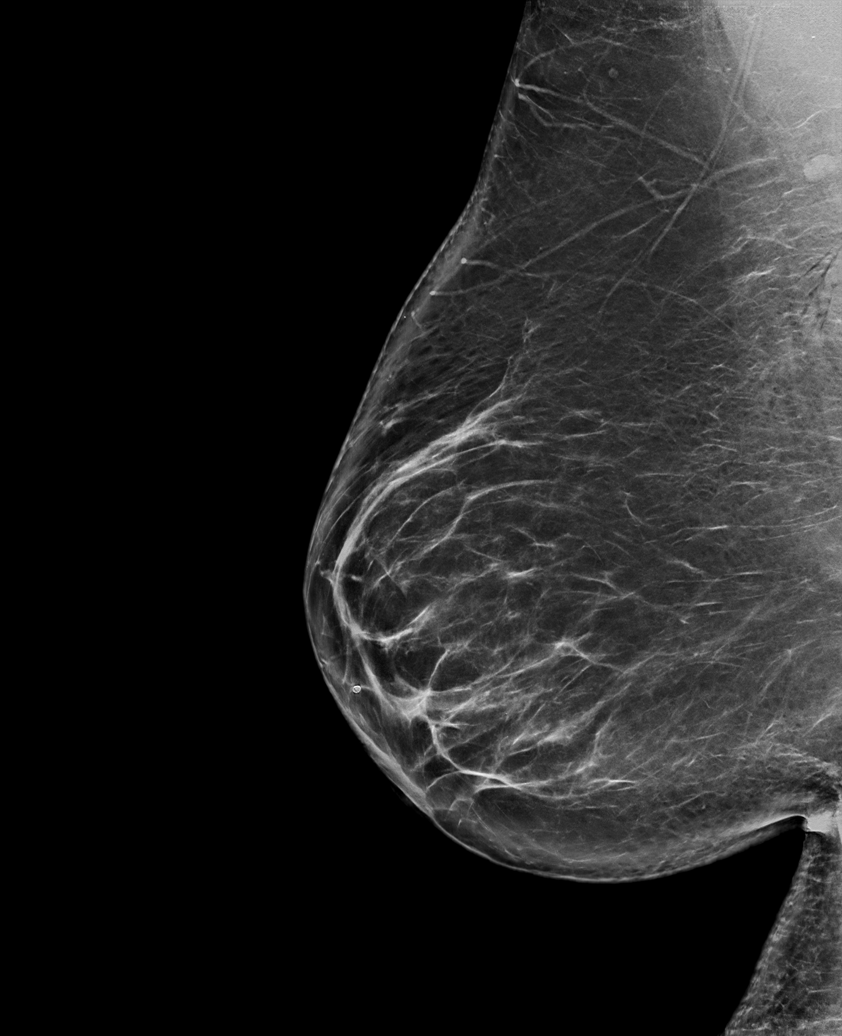

[L MLO synth-2D (2 of 2)]
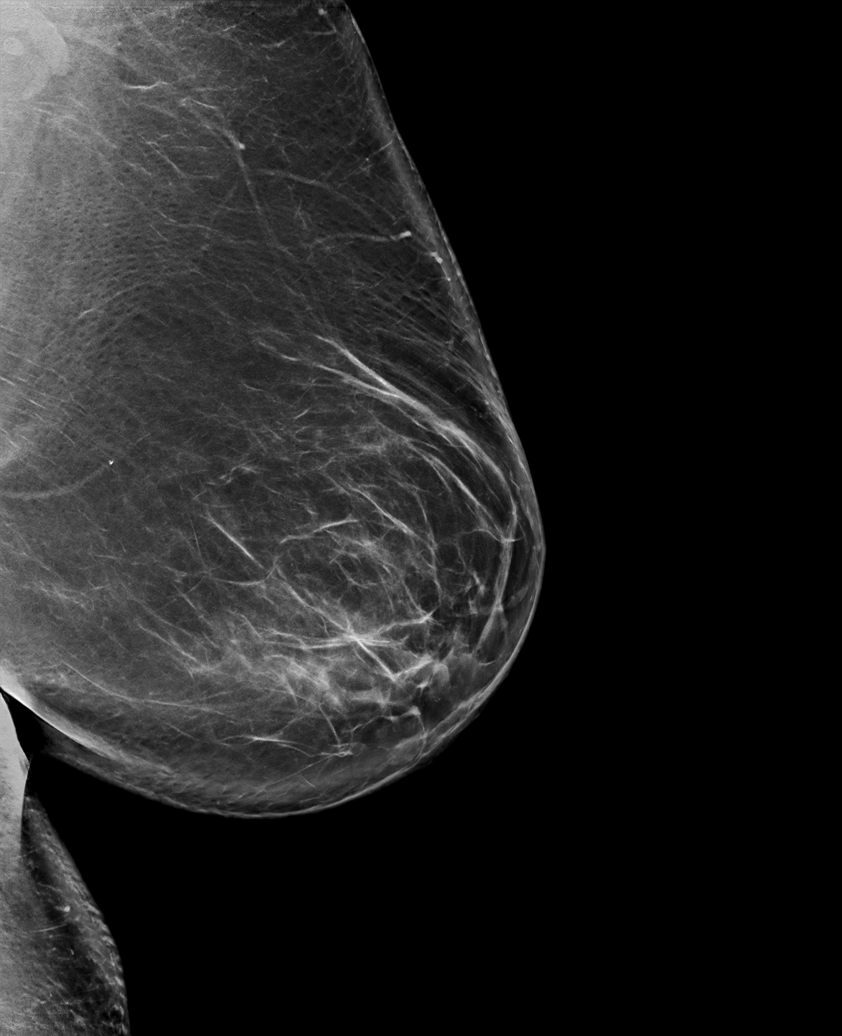

[L CC synth-2D]
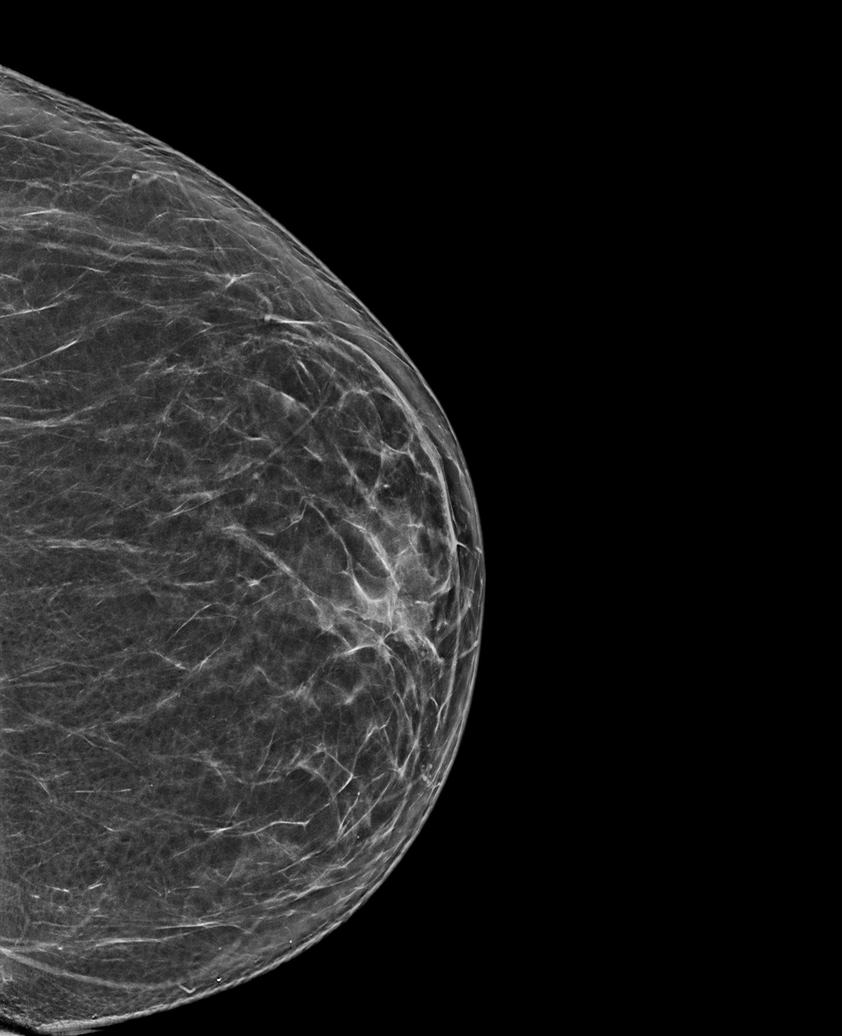

[L MLO tomo · tomo slice 55/108.0]
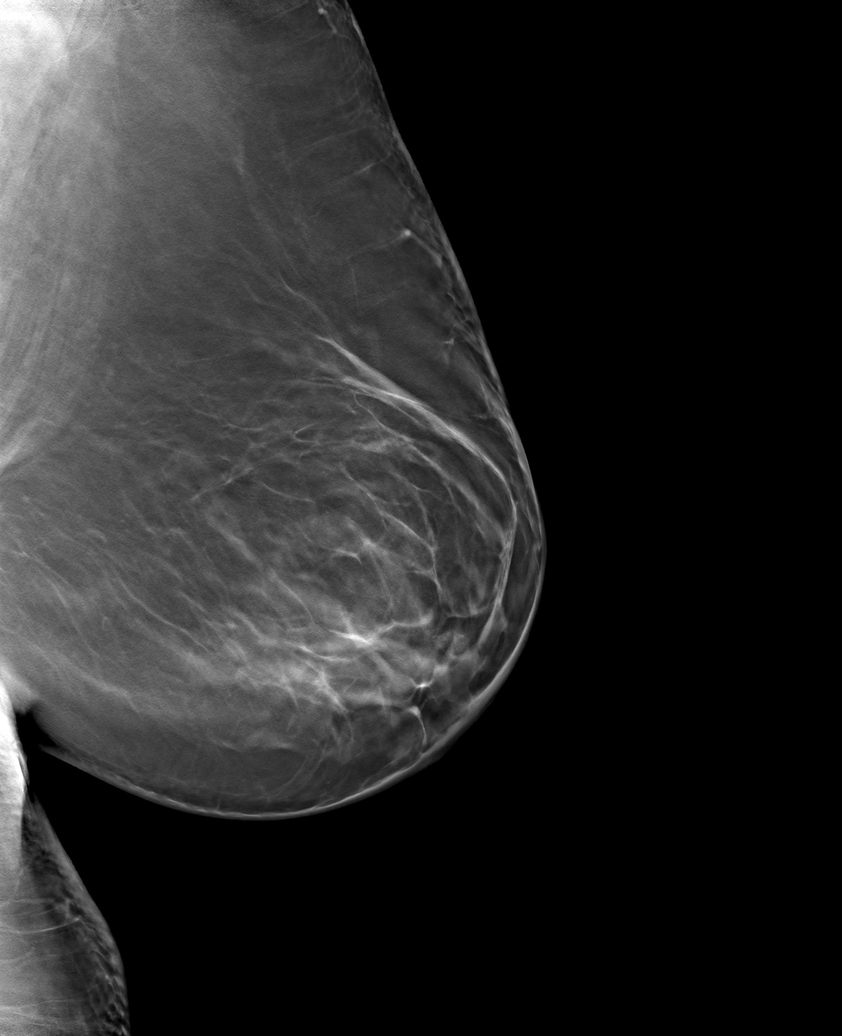

[6 of 30 positions shown; findings below may reference images not displayed]

ACR Breast Density Category b: There are scattered areas of
fibroglandular density.
FINDINGS: There are no findings suspicious for malignancy.
IMPRESSION: No mammographic evidence of malignancy. A result letter of this
screening mammogram will be mailed directly to the patient.

RECOMMENDATION:
Screening mammogram in one year. (Code:[id number]-U-[id number])

BI-RADS CATEGORY  1: Negative.
